# Patient Record
Sex: Female | Born: 1995 | Hispanic: No | Marital: Single | Smoking: Never smoker
Health system: Southern US, Community
[De-identification: ages and names within clinical notes are randomized; demographics above are authoritative.]

## PROBLEM LIST (undated history)

## (undated) DIAGNOSIS — F419 Anxiety disorder, unspecified: Secondary | ICD-10-CM

## (undated) DIAGNOSIS — Z905 Acquired absence of kidney: Secondary | ICD-10-CM

## (undated) HISTORY — PX: NEPHRECTOMY: SHX65

---

## 2015-02-03 ENCOUNTER — Encounter (HOSPITAL_COMMUNITY): Payer: Self-pay | Admitting: *Deleted

## 2015-02-03 ENCOUNTER — Emergency Department (HOSPITAL_COMMUNITY)
Admission: EM | Admit: 2015-02-03 | Discharge: 2015-02-03 | Disposition: A | Discharge status: Home / Self Care | Payer: Medicaid Other | Attending: Emergency Medicine | Admitting: Emergency Medicine

## 2015-02-03 DIAGNOSIS — N390 Urinary tract infection, site not specified: Secondary | ICD-10-CM

## 2015-02-03 DIAGNOSIS — M549 Dorsalgia, unspecified: Secondary | ICD-10-CM | POA: Diagnosis present

## 2015-02-03 DIAGNOSIS — Z3202 Encounter for pregnancy test, result negative: Secondary | ICD-10-CM | POA: Insufficient documentation

## 2015-02-03 HISTORY — DX: Acquired absence of kidney: Z90.5

## 2015-02-03 LAB — URINALYSIS, ROUTINE W REFLEX MICROSCOPIC
BILIRUBIN URINE: NEGATIVE
GLUCOSE, UA: NEGATIVE mg/dL
HGB URINE DIPSTICK: NEGATIVE
Ketones, ur: NEGATIVE mg/dL
Nitrite: NEGATIVE
PH: 6.5 (ref 5.0–8.0)
Protein, ur: NEGATIVE mg/dL
SPECIFIC GRAVITY, URINE: 1.008 (ref 1.005–1.030)
Urobilinogen, UA: 0.2 mg/dL (ref 0.0–1.0)

## 2015-02-03 LAB — COMPREHENSIVE METABOLIC PANEL
ALBUMIN: 4.2 g/dL (ref 3.5–5.0)
ALT: 15 U/L (ref 14–54)
AST: 19 U/L (ref 15–41)
Alkaline Phosphatase: 75 U/L (ref 38–126)
Anion gap: 9 (ref 5–15)
BUN: 9 mg/dL (ref 6–20)
CO2: 25 mmol/L (ref 22–32)
CREATININE: 0.8 mg/dL (ref 0.44–1.00)
Calcium: 9.7 mg/dL (ref 8.9–10.3)
Chloride: 104 mmol/L (ref 101–111)
GFR calc Af Amer: >60 mL/min (ref >60)
GFR calc non Af Amer: >60 mL/min (ref >60)
Glucose, Bld: 94 mg/dL (ref 65–99)
Potassium: 4.6 mmol/L (ref 3.5–5.1)
Sodium: 138 mmol/L (ref 135–145)
TOTAL PROTEIN: 7.4 g/dL (ref 6.5–8.1)
Total Bilirubin: 0.5 mg/dL (ref 0.3–1.2)

## 2015-02-03 LAB — URINE MICROSCOPIC-ADD ON

## 2015-02-03 LAB — CBC
HCT: 44.6 % (ref 36.0–46.0)
Hemoglobin: 14.7 g/dL (ref 12.0–15.0)
MCH: 29.6 pg (ref 26.0–34.0)
MCHC: 33 g/dL (ref 30.0–36.0)
MCV: 89.9 fL (ref 78.0–100.0)
PLATELETS: 415 10*3/uL — AB (ref 150–400)
RBC: 4.96 MIL/uL (ref 3.87–5.11)
RDW: 15.1 % (ref 11.5–15.5)
WBC: 8.2 10*3/uL (ref 4.0–10.5)

## 2015-02-03 LAB — POC URINE PREG, ED: Preg Test, Ur: NEGATIVE

## 2015-02-03 MED ORDER — CEPHALEXIN 500 MG PO CAPS: 500.0000 mg | ORAL_CAPSULE | Freq: Four times a day (QID) | ORAL | Status: AC

## 2015-02-03 NOTE — ED Provider Notes (Signed)
CSN: [id number]     Arrival date & time 02/03/15  1154 History   First MD Initiated Contact with Patient 02/03/15 1355     Chief Complaint  Patient presents with  . Back Pain     (Consider location/radiation/quality/duration/timing/severity/associated sxs/prior Treatment) HPI Ms. 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  She also has intermittent nausea and one episode of vomiting last week.  She states she was diagnosed with a UTI 5 weeks ago and was given antibiotics.  Her symptoms improved but returned after one week. She admits to urinary frequency for the past week. She denies any fever, shortness of breath, abdominal pain, diarrhea, constipation, hematuria or dysuria.  Past Medical History  Diagnosis Date  . History of nephrectomy     left removed when patient was a baby   History reviewed. No pertinent past surgical history. History reviewed. No pertinent family history. History  Substance Use Topics  . Smoking status: Never Smoker   . Smokeless tobacco: Not on file  . Alcohol Use: Not on file   OB History    No data available     Review of Systems  Genitourinary: Positive for flank pain.  All other systems reviewed and are negative.     Allergies  Iodides  Home Medications   Prior to Admission medications   Medication Sig Start Date End Date Taking? Authorizing Provider  cephALEXin (KEFLEX) 500 MG capsule Take 1 capsule (500 mg total) by mouth 4 (four) times daily. 02/03/15   Koda Routon Patel-Mills, PA-C   BP 125/83 mmHg  Pulse 73  Temp(Src) 98.5 F (36.9 C) (Oral)  Resp 18  Wt 153 lb (69.4 kg)  SpO2 99%  LMP 01/12/2015 Physical Exam  Constitutional: She is oriented to person, place, and time. She appears well-developed and well-nourished.  HENT:  Head: Normocephalic and atraumatic.  Eyes: Conjunctivae are normal.  Neck: Normal range of motion. Neck  supple.  Cardiovascular: Normal rate, regular rhythm and normal heart sounds.   Pulmonary/Chest: Effort normal and breath sounds normal.  Abdominal: Soft. There is no tenderness. There is no CVA tenderness.  Musculoskeletal: Normal range of motion.  Neurological: She is alert and oriented to person, place, and time.  Skin: Skin is warm and dry.    ED Course  Procedures (including critical care time) Labs Review Labs Reviewed  CBC - Abnormal; Notable for the following:    Platelets 415 (*)    All other components within normal limits  URINALYSIS, ROUTINE W REFLEX MICROSCOPIC - Abnormal; Notable for the following:    Leukocytes, UA MODERATE (*)    All other components within normal limits  URINE MICROSCOPIC-ADD ON - Abnormal; Notable for the following:    Squamous Epithelial / LPF FEW (*)    Bacteria, UA FEW (*)    All other components within normal limits  URINE CULTURE  COMPREHENSIVE METABOLIC PANEL  POC URINE PREG, ED    Imaging Review No results found.   EKG Interpretation None      MDM   Final diagnoses:  Urinary tract infection, acute   Patient with a history of left nephrectomy from Wilms tumor presents for right sided back pain x 1 month.  She also has urinary frequency but no dysuria or hematuria. No fever. She has not seen her nephrologist since she was 19 y.o. [place] Consulting civil [place]  [place]cp [place] 2 hours away in her hometown.   She is afebrile and her labs are unremarkable.  She does have a UTI.  I am not concerned about a kidney stone.  She has no reproducible abdominal or CVA tenderness of exam.  I have given her keflex and the resource guide to find a local provider.  I discussed return precautions such as fever or no improvement with symptoms in 2 days.     Catha GosselinHanna Patel-Mills, PA-C 02/03/15 1610  Elwin MochaBlair Walden, MD 02/03/15 [phone]

## 2015-02-03 NOTE — ED Notes (Signed)
PA at bedside.

## 2015-02-03 NOTE — ED Notes (Addendum)
Pt in c/o lower back pain for the last month, intermittent nausea, reports one episode of vomiting last week, no distress noted. Pt states she went to her PCP and dx with a UTI, took antibiotics without improvement of symptoms, pt had left kidney removed as a child.

## 2015-02-03 NOTE — Discharge Instructions (Signed)
Urinary Tract Infection Take antibiotics as prescribed.  Return for fever or if your symptoms do not improve in 2 days.  Follow up with a primary care provider using the resource guide or the health clinic at school. A urinary tract infection (UTI) can occur any place along the urinary tract. The tract includes the kidneys, ureters, bladder, and urethra. A type of germ called bacteria often causes a UTI. UTIs are often helped with antibiotic medicine.  HOME CARE   If given, take antibiotics as told by your doctor. Finish them even if you start to feel better.  Drink enough fluids to keep your pee (urine) clear or pale yellow.  Avoid tea, drinks with caffeine, and bubbly (carbonated) drinks.  Pee often. Avoid holding your pee in for a long time.  Pee before and after having sex (intercourse).  Wipe from front to back after you poop (bowel movement) if you are a woman. Use each tissue only once. GET HELP RIGHT AWAY IF:   You have back pain.  You have lower belly (abdominal) pain.  You have chills.  You feel sick to your stomach (nauseous).  You throw up (vomit).  Your burning or discomfort with peeing does not go away.  You have a fever.  Your symptoms are not better in 3 days. MAKE SURE YOU:   Understand these instructions.  Will watch your condition.  Will get help right away if you are not doing well or get worse. Document Released: 02/22/2008 Document Revised: 05/30/2012 Document Reviewed: 04/05/2012 [facility] Patient Information 2015 [place], [place]. This information is not intended to replace advice given to you by your health care provider. Make sure you discuss any questions you have with your health care provider.  Emergency Department Resource Guide 1) Find a Doctor and Pay Out of Pocket Although you won't have to find out who is covered by your insurance plan, it is a good idea to ask around and get recommendations. You will then need to call the office and see if  the doctor you have chosen will accept you as a new patient and what types of options they offer for patients who are self-pay. Some doctors offer discounts or will set up payment plans for their patients who do not have insurance, but you will need to ask so you aren't surprised when you get to your appointment.  2) Contact Your Local Health Department Not all health departments have doctors that can see patients for sick visits, but many do, so it is worth a call to see if yours does. If you don't know where your local health department is, you can check in your phone book. The CDC also has a tool to help you locate your state's health department, and many state websites also have listings of all of their local health departments.  3) Find a Walk-in Clinic If your illness is not likely to be very severe or complicated, you may want to try a walk in clinic. These are popping up all over the country in pharmacies, drugstores, and shopping centers. They're usually staffed by nurse practitioners or physician assistants that have been trained to treat common illnesses and complaints. They're usually fairly quick and inexpensive. However, if you have serious medical issues or chronic medical problems, these are probably not your best option.  No Primary Care Doctor: - Call Health Connect at  [phone] - they can help you locate a primary care doctor that  accepts your insurance, provides certain services, etc. -  Physician Referral Service- [phone]  Chronic Pain Problems: Organization         Address  Phone   Notes  [facility]  [phone] Patients need to be referred by their primary care doctor.   Medication Assistance: Organization         Address  Phone   Notes  [facility] Medication [facility] [address] [place] [place], [place] [phone] --Must be a resident of [facility] -- Must have NO insurance coverage whatsoever (no  Medicaid/ Medicare, etc.) -- The pt. MUST have a primary care doctor that directs their care regularly and follows them in the community   [facility]ist  [phone]   [employer]  [phone]    Agencies that provide inexpensive medical care: Organization         Address  Phone   Notes  [facility]  [phone]   [facility]    [phone]   [facility] [address] [place], [place] [address] [phone]   [facility] [address] [place], [place] [phone]   [facility]anned Parenthood    [phone]   [facility]    [phone]   [facility]  [address], [place] Phone:  [phone], Fax:  [phone] Hours of Operation:  9 am - 6 pm, M-F.  Also accepts Medicaid/Medicare and self-pay.  [facility]  [address], [place] Phone: [phone], Fax: [phone]. Hours of Operation:  8:30 am - 5:30 pm, M-F.  Also accepts Medicaid and self-pay.  [facility] [place] [address], [place] Phone: [phone]   [facility] [address] Natasha Bence [place], [place] [phone], Ext. [phone] Mondays & Thursdays: 7-9 AM.  First 15 patients are seen on a first come, first serve basis.    Medicaid-accepting [facility] Providers:  Organization         Address  Phone   Notes  [facility] [address],  [phone] Also accepts self-pay patients.  [facility] [address] Laurell Josephs [place], [place]  [phone]   [facility] [address], [place] [phone]   [facility] [address] [place] [phone]   Renaye Rakers [address], [place]   [phone] Only accepts [place] Access [place] patients after they have their name applied to their card.    Self-Pay (no insurance) in [facility]:  Organization         Address  Phone   Notes  Sickle Cell Patients, [facility] [address] [place], [place] [phone]   [facility] [address] [place], [place] [phone]   [facility] [place]  [address], [place] [phone]   [facility]/Dr. Osei-Bonsu  [address] [place] or [address], [place] [phone] Phone number for both [place] and [place] locations is the same.  [facility] [address] [place] [phone]   [facility] [address], [place] or [address] [phone]   [facility] [address] [place], [place] [phone],  phone; [phone], fax Sees patients 1st and 3rd Saturday of every month.  Must not qualify for public or private insurance (i.e. Medicaid, Medicare, [place] Health Choice, Veterans' Benefits)  Household income should be no more than 200% of the poverty level The clinic cannot treat you if you are pregnant or think you are pregnant  Sexually transmitted diseases are not treated at the clinic.    Dental Care: Organization         Address  Phone  Notes  [facility] [address], [place] [phone] Accepts children up to age 21 who are enrolled in [place] or [place] Health Choice; pregnant women with a Medicaid card; and children who have applied for Medicaid or [place] Health Choice, but were declined, whose parents can pay a reduced fee at time of service.  [facility] [place]  [address], [place] [phone] Accepts children up to age 21 who are enrolled in [place] or [place] Health Choice; pregnant women with a Medicaid card; and children who have applied for Medicaid or [place] Health Choice,  but were declined, whose parents can pay a reduced fee at time of service.  [facility]  [address], [place] [phone] Patients are seen by appointment only. Walk-ins are not accepted. [facility] will see patients 318 years of age and older. Monday - Tuesday (8am-5pm) Most Wednesdays (8:30-5pm) $30 per visit, cash only  [facility]  [address], [facility] [place] [phone] Patients are seen by appointment only. Walk-ins are not accepted. [facility] will see patients 21 years of age and older. One Wednesday Evening (Monthly: Volunteer Based).  $30 per visit, cash only  [employer] [facility]  [phone] for adults; Children under age 544, call Graduate Pediatric Dentistry at [phone]. Children aged 364-14, please call [phone] to request a pediatric application.  Dental services are provided in all areas of dental care including fillings, crowns and bridges, complete and partial dentures, implants, gum treatment, root canals, and extractions. Preventive care is also provided. Treatment is provided to both adults and children. Patients are selected via a lottery and there is often a waiting list.   [facility] [address], [place]  [phone] [email or web address]   [facility] [address], [place], [place] [phone], Ext. [phone] Second and Fourth Thursday of each month, opens at 6:30 AM; Clinic ends at 9 AM.  Patients are seen on a first-come first-served basis, and a limited number are seen during each clinic.   [facility]  [address], [place], [place] [phone]   Eligibility Requirements You must have lived in [place], [place], or [place] counties for at least the last three months.   You cannot be eligible for state or federal sponsored [place] Cityhealthcare insurance, including CIGNAVeterans Administration, [place]icaid, or [employer]Medicare.   You generally  cannot be eligible for healthcare insurance through your employer.    How to apply: Eligibility screenings are held every Tuesday and Wednesday afternoon from 1:00 pm until 4:00 pm. You do not need an appointment for the interview!  [facility] [address], [place], [place] [phone]   [facility]  [phone]   [facility]  [phone]   [facility]  [phone]    Behavioral Health Resources in  the Community: Intensive Outpatient Programs Organization         Address  Phone  Notes  [facility] [address] [place], [place] [phone]   [facility] Outpatient [address] [place], [place] [phone]   [facility] [address] [place], [place]  [phone]   [facility] [address],  [place], [place] [phone] or [phone]   Substance Abuse Resources Organization         Address  Phone  Notes  [facility] Drug Services  [phone]   [facility]  [phone]   [facility]  [phone]   Floydene Flock  [phone]   Residential & Outpatient Substance Abuse Program  [phone]   Psychological Services Organization         Address  Phone  Notes  [facility]  [phone]   [facility]  [phone]   [facility] [address] [place] [phone] or [phone]    Mobile Crisis Teams Organization         Address  Phone  Notes  [facility], Mobile Crisis Care Unit  [phone]   [facility]  [address] [place], [place] [phone]   Doristine Locks [address] [place] [phone]    Self-Help/Support Groups Organization         Address  Phone             Notes  [facility] [place] - variety of support groups  [phone]- I7437963 Call for more  information  Narcotics Anonymous (NA), [facility] [address], [employer] [place]  2 meetings at this location   Statistician         Address  Phone  Notes  [facility] [address],    [place]  [phone]   [facility]  [address] [place] [address], [place], [place] [phone]   [facility] [address] [place], [place] [phone] Admissions: 8am-3pm M-F  [facility] [address],    [place], [place] [phone]   [facility] [address] [place], [place], [place] [phone]   [facility] [address]  [place], [place] [phone]   [facility] - Intensive Outpatient [address]ure[address], [place], [place] [phone]   [facility] ([facility] [address] [place]  [place], [place] [phone] or [phone]   [facility] ([facility]) [address], [place], [place] [phone] Accepts Medicaid  [facility] [address],  [place] [phone] Substance Abuse/Addiction Treatment   [facility] Organization         Address  Phone  Notes  [facility]  [phone]   Angie Fava, PhD [address] Ervin Knack [place], [place]   [phone] or [phone]   [facility] Behavioral   [address] [place], [place] [phone]   [facility] [address], [place], [place] [phone] Insurance/Medicaid/sponsorship through [employer] and Families [address]                                    [place], [place] [phone] Therapy/tele-psych/case  [facility] [address], [place] [phone]  Dr. Adele Schilder  [phone]   [facility] Dept. 1) [address], [place] 2) [place] 3)  [address], [place] [phone]  [phone]   [place] [phone] or [phone] (After Hours)

## 2015-02-05 LAB — URINE CULTURE: Colony Count: 35000

## 2015-02-06 ENCOUNTER — Telehealth (HOSPITAL_BASED_OUTPATIENT_CLINIC_OR_DEPARTMENT_OTHER): Payer: Self-pay | Admitting: Emergency Medicine

## 2015-02-06 NOTE — Telephone Encounter (Signed)
Post ED Visit - Positive Culture Follow-up  Culture report reviewed by antimicrobial stewardship pharmacist: []  Wes Dulaney, Pharm.D., BCPS [x]  Celedonio MiyamotoJeremy Frens, Pharm.D., BCPS []  Georgina PillionElizabeth Martin, [address] BoulevardPharm.D., BCPS []  LinnMinh Pham, [address] BoulevardPharm.D., BCPS, AAHIVP []  Estella HuskMichelle Turner, Pharm.D., BCPS, AAHIVP []  Elder CyphersLorie Poole, [address] BoulevardPharm.D., BCPS  Positive urine Culture Group B strep Treated with cephalexin, organism sensitive to the same and no further patient follow-up is required at this time.  Berle MullMiller, Jeremia Groot 02/06/2015, 1:02 PM

## 2016-12-14 ENCOUNTER — Emergency Department (HOSPITAL_COMMUNITY): Payer: Self-pay

## 2016-12-14 ENCOUNTER — Encounter (HOSPITAL_COMMUNITY): Payer: Self-pay | Admitting: Emergency Medicine

## 2016-12-14 ENCOUNTER — Emergency Department (HOSPITAL_COMMUNITY)
Admission: EM | Admit: 2016-12-14 | Discharge: 2016-12-14 | Disposition: A | Discharge status: Home / Self Care | Payer: Self-pay | Attending: Emergency Medicine | Admitting: Emergency Medicine

## 2016-12-14 DIAGNOSIS — R1013 Epigastric pain: Secondary | ICD-10-CM | POA: Insufficient documentation

## 2016-12-14 DIAGNOSIS — R0789 Other chest pain: Secondary | ICD-10-CM | POA: Insufficient documentation

## 2016-12-14 HISTORY — DX: Anxiety disorder, unspecified: F41.9

## 2016-12-14 LAB — BASIC METABOLIC PANEL
ANION GAP: 8 (ref 5–15)
BUN: 10 mg/dL (ref 6–20)
CALCIUM: 9.4 mg/dL (ref 8.9–10.3)
CO2: 23 mmol/L (ref 22–32)
Chloride: 106 mmol/L (ref 101–111)
Creatinine, Ser: 0.85 mg/dL (ref 0.44–1.00)
GFR calc Af Amer: >60 mL/min (ref >60)
Glucose, Bld: 90 mg/dL (ref 65–99)
Potassium: 4.1 mmol/L (ref 3.5–5.1)
SODIUM: 137 mmol/L (ref 135–145)

## 2016-12-14 LAB — I-STAT BETA HCG BLOOD, ED (MC, WL, AP ONLY): I-stat hCG, quantitative: <5 m[IU]/mL (ref <5)

## 2016-12-14 LAB — CBC
HCT: 43.2 % (ref 36.0–46.0)
HEMOGLOBIN: 14.4 g/dL (ref 12.0–15.0)
MCH: 30.5 pg (ref 26.0–34.0)
MCHC: 33.3 g/dL (ref 30.0–36.0)
MCV: 91.5 fL (ref 78.0–100.0)
Platelets: 415 10*3/uL — ABNORMAL HIGH (ref 150–400)
RBC: 4.72 MIL/uL (ref 3.87–5.11)
RDW: 13.1 % (ref 11.5–15.5)
WBC: 8.4 10*3/uL (ref 4.0–10.5)

## 2016-12-14 LAB — HEPATIC FUNCTION PANEL
ALT: 17 U/L (ref 14–54)
AST: 22 U/L (ref 15–41)
Albumin: 4.6 g/dL (ref 3.5–5.0)
Alkaline Phosphatase: 69 U/L (ref 38–126)
Total Bilirubin: 0.7 mg/dL (ref 0.3–1.2)
Total Protein: 7.8 g/dL (ref 6.5–8.1)

## 2016-12-14 LAB — I-STAT TROPONIN, ED: Troponin i, poc: 0 ng/mL (ref 0.00–0.08)

## 2016-12-14 LAB — LIPASE, BLOOD: Lipase: 28 U/L (ref 11–51)

## 2016-12-14 MED ORDER — SUCRALFATE 1 G PO TABS
1.0000 g | ORAL_TABLET | Freq: Once | ORAL | Status: AC
  Administered 2016-12-14: 1 g via ORAL
  Filled 2016-12-14: qty 1

## 2016-12-14 MED ORDER — OMEPRAZOLE 20 MG PO CPDR: 20.0000 mg | DELAYED_RELEASE_CAPSULE | Freq: Every day | ORAL | 0 refills | Status: AC

## 2016-12-14 MED ORDER — IBUPROFEN 200 MG PO TABS
600.0000 mg | ORAL_TABLET | Freq: Once | ORAL | Status: AC
  Administered 2016-12-14: 600 mg via ORAL
  Filled 2016-12-14: qty 3

## 2016-12-14 NOTE — ED Provider Notes (Signed)
[facility]-EMERGENCY DEPT Provider Note   CSN: [id number] Arrival date & time: 12/14/16  1345     History   Chief Complaint Chief Complaint  Patient presents with  . Chest Pain  . Tingling    HPI Ashley Cunningham is a 21 y.o. female.  HPI  Pt comes in with cc of abd pain, chest discomfort. Pt has no medical hx, she is s/p L sided nephrectomy due to Wilm's tumor. Pt reports that for a 2 days she has been having epigastric abd pain and chest pain. Chest pain feels like tightness and she is having heartburn. Pt is not sure if the symptoms are related to food intake. Pt has no worsening of the pain with exertion. Pt has hx of anxiety, and she does get some dib intermittently. Pt also had nausea intermittently.  Pt has no hx of PE, DVT and denies any exogenous hormone (testosterone / estrogen) use, long distance travels or surgery in the past 6 weeks, active cancer, recent immobilization. Pt's BM are normal. No pelvic pain. Patient has no pain with urination, blood in the urine, or frequent urination. Pt also denies any vaginal discharge or bleeding.     Past Medical History:  Diagnosis Date  . Anxiety   . History of nephrectomy    left removed when patient was a baby    There are no active problems to display for this patient.   Past Surgical History:  Procedure Laterality Date  . NEPHRECTOMY     Right    OB History    No data available       Home Medications    Prior to Admission medications   Medication Sig Start Date End Date Taking? Authorizing Provider  cephALEXin (KEFLEX) 500 MG capsule Take 1 capsule (500 mg total) by mouth 4 (four) times daily. Patient not taking: Reported on 12/14/2016 02/03/15   Catha GosselinHanna Patel-Mills, PA-C  omeprazole (PRILOSEC) 20 MG capsule Take 1 capsule (20 mg total) by mouth daily. 12/14/16   Derwood KaplanAnkit Moshe Wenger, MD    Family History No family history on file.  Social History Social History  Substance Use Topics  . Smoking status:  Never Smoker  . Smokeless tobacco: Never Used  . Alcohol use No     Allergies   Iodides   Review of Systems Review of Systems  ROS 10 Systems reviewed and are negative for acute change except as noted in the HPI.     Physical Exam Updated Vital Signs BP (!) 141/102   Pulse 74   Temp 98.4 F (36.9 C) (Oral)   Resp 17   Ht 5\' 4"  (1.626 m)   Wt 150 lb (68 kg)   LMP 11/03/2016   SpO2 100%   BMI 25.75 kg/m   Physical Exam  Constitutional: She is oriented to person, place, and time. She appears well-developed and well-nourished.  HENT:  Head: Normocephalic and atraumatic.  Eyes: EOM are normal. Pupils are equal, round, and reactive to light.  Neck: Neck supple.  Cardiovascular: Normal rate, regular rhythm, normal heart sounds and intact distal pulses.   No murmur heard. Pulmonary/Chest: Effort normal. No respiratory distress. She has no wheezes.  Abdominal: Soft. She exhibits no distension. There is tenderness. There is no rebound and no guarding.  Epigastric and RUQ tenderness  Neurological: She is alert and oriented to person, place, and time.  Skin: Skin is warm and dry.  Nursing note and vitals reviewed.    ED Treatments / Results  Labs (all  labs ordered are listed, but only abnormal results are displayed) Labs Reviewed  CBC - Abnormal; Notable for the following:       Result Value   Platelets 415 (*)    All other components within normal limits  HEPATIC FUNCTION PANEL - Abnormal; Notable for the following:    Bilirubin, Direct <0.1 (*)    All other components within normal limits  BASIC METABOLIC PANEL  LIPASE, BLOOD  I-STAT TROPOININ, ED  I-STAT BETA HCG BLOOD, ED ([facility], [facility], [facility] ONLY)    EKG  EKG Interpretation  Date/Time:  Wednesday December 14 2016 14:01:21 EDT Ventricular Rate:  75 PR Interval:    QRS Duration: 87 QT Interval:  365 QTC Calculation: 408 R Axis:   82 Text Interpretation:  Sinus rhythm RSR' in V1 or V2, right VCD or RVH ST elev,  probable normal early repol pattern No acute changes No old tracing to compare Confirmed by Rhunette Croft, MD, Janey Genta [phone]) on 12/14/2016 6:07:16 PM       Radiology Dg Chest 2 View  Result Date: 12/14/2016 CLINICAL DATA:  Anxiety attack and chest tightness, initial encounter EXAM: CHEST  2 VIEW COMPARISON:  None. FINDINGS: The heart size and mediastinal contours are within normal limits. Both lungs are clear. The visualized skeletal structures are unremarkable. IMPRESSION: No active cardiopulmonary disease. Electronically Signed   By: Alcide Clever M.D.   On: 12/14/2016 15:15   US Abdomen Limited  Result Date: 12/14/2016 CLINICAL DATA:  Right upper quadrant pain x1 day EXAM: US ABDOMEN LIMITED - RIGHT UPPER QUADRANT COMPARISON:  None. FINDINGS: Gallbladder: No gallstones or wall thickening visualized. No sonographic Murphy sign noted by sonographer. Common bile duct: Diameter: 3.6 mm.  No choledocholithiasis. Liver: No focal lesion identified. Within normal limits in parenchymal echogenicity. IMPRESSION: Unremarkable right upper quadrant abdominal ultrasound. Electronically Signed   By: Tollie Eth M.D.   On: 12/14/2016 19:21    Procedures Procedures (including critical care time)  Medications Ordered in ED Medications  sucralfate (CARAFATE) tablet 1 g (not administered)  ibuprofen (ADVIL,MOTRIN) tablet 600 mg (not administered)     Initial Impression / Assessment and Plan / ED Course  I have reviewed the triage vital signs and the nursing notes.  Pertinent labs & imaging results that were available during my care of the patient were reviewed by me and considered in my medical decision making (see chart for details).     Pt comes in with cc of epigastric chest pain. Hx of anxiety - per patient.  DDx includes: Pancreatitis Hepatobiliary pathology including cholecystitis Gastritis/PUD ACS syndrome PE  Pt is PERC neg. Pt has no cardiac risk factors. Trops  And ekg reassuring.  Pt  has epigastric tenderness - but lipase is normal and LFTs are fine. [place] ordered.  If workup neg, we will d/c with pcp f.u for PUD eval.    Final Clinical Impressions(s) / ED Diagnoses   Final diagnoses:  Atypical chest pain  Acute epigastric pain    New Prescriptions New Prescriptions   OMEPRAZOLE (PRILOSEC) 20 MG CAPSULE    Take 1 capsule (20 mg total) by mouth daily.     Derwood Kaplan, MD 12/14/16 2021

## 2016-12-14 NOTE — Discharge Instructions (Signed)
All the results in the ER are normal, labs and imaging. We are not sure what is causing your symptoms. It could be peptic ulcer disease - so er have started you on medications for that. The workup in the ER is not complete, and is limited to screening for life threatening and emergent conditions only, so please see a primary care doctor for further evaluation.  Please return to the ER if your symptoms worsen; you have increased pain, fevers, chills, inability to keep any medications down, confusion. Otherwise see the outpatient doctor as requested.

## 2016-12-14 NOTE — ED Triage Notes (Signed)
Patient states "I think I am having an anxiety attack." Patient reports tingling in bilateral upper extremities and chest tightness starting yesterday. Hx of anxiety.

## 2018-01-08 IMAGING — CR DG CHEST 2V
2 series · 2 of 2 positions shown · non-contrast
Comparison: None.

CLINICAL DATA: Anxiety attack and chest tightness, initial
encounter

EXAM:
CHEST  2 VIEW

[w chest pa]
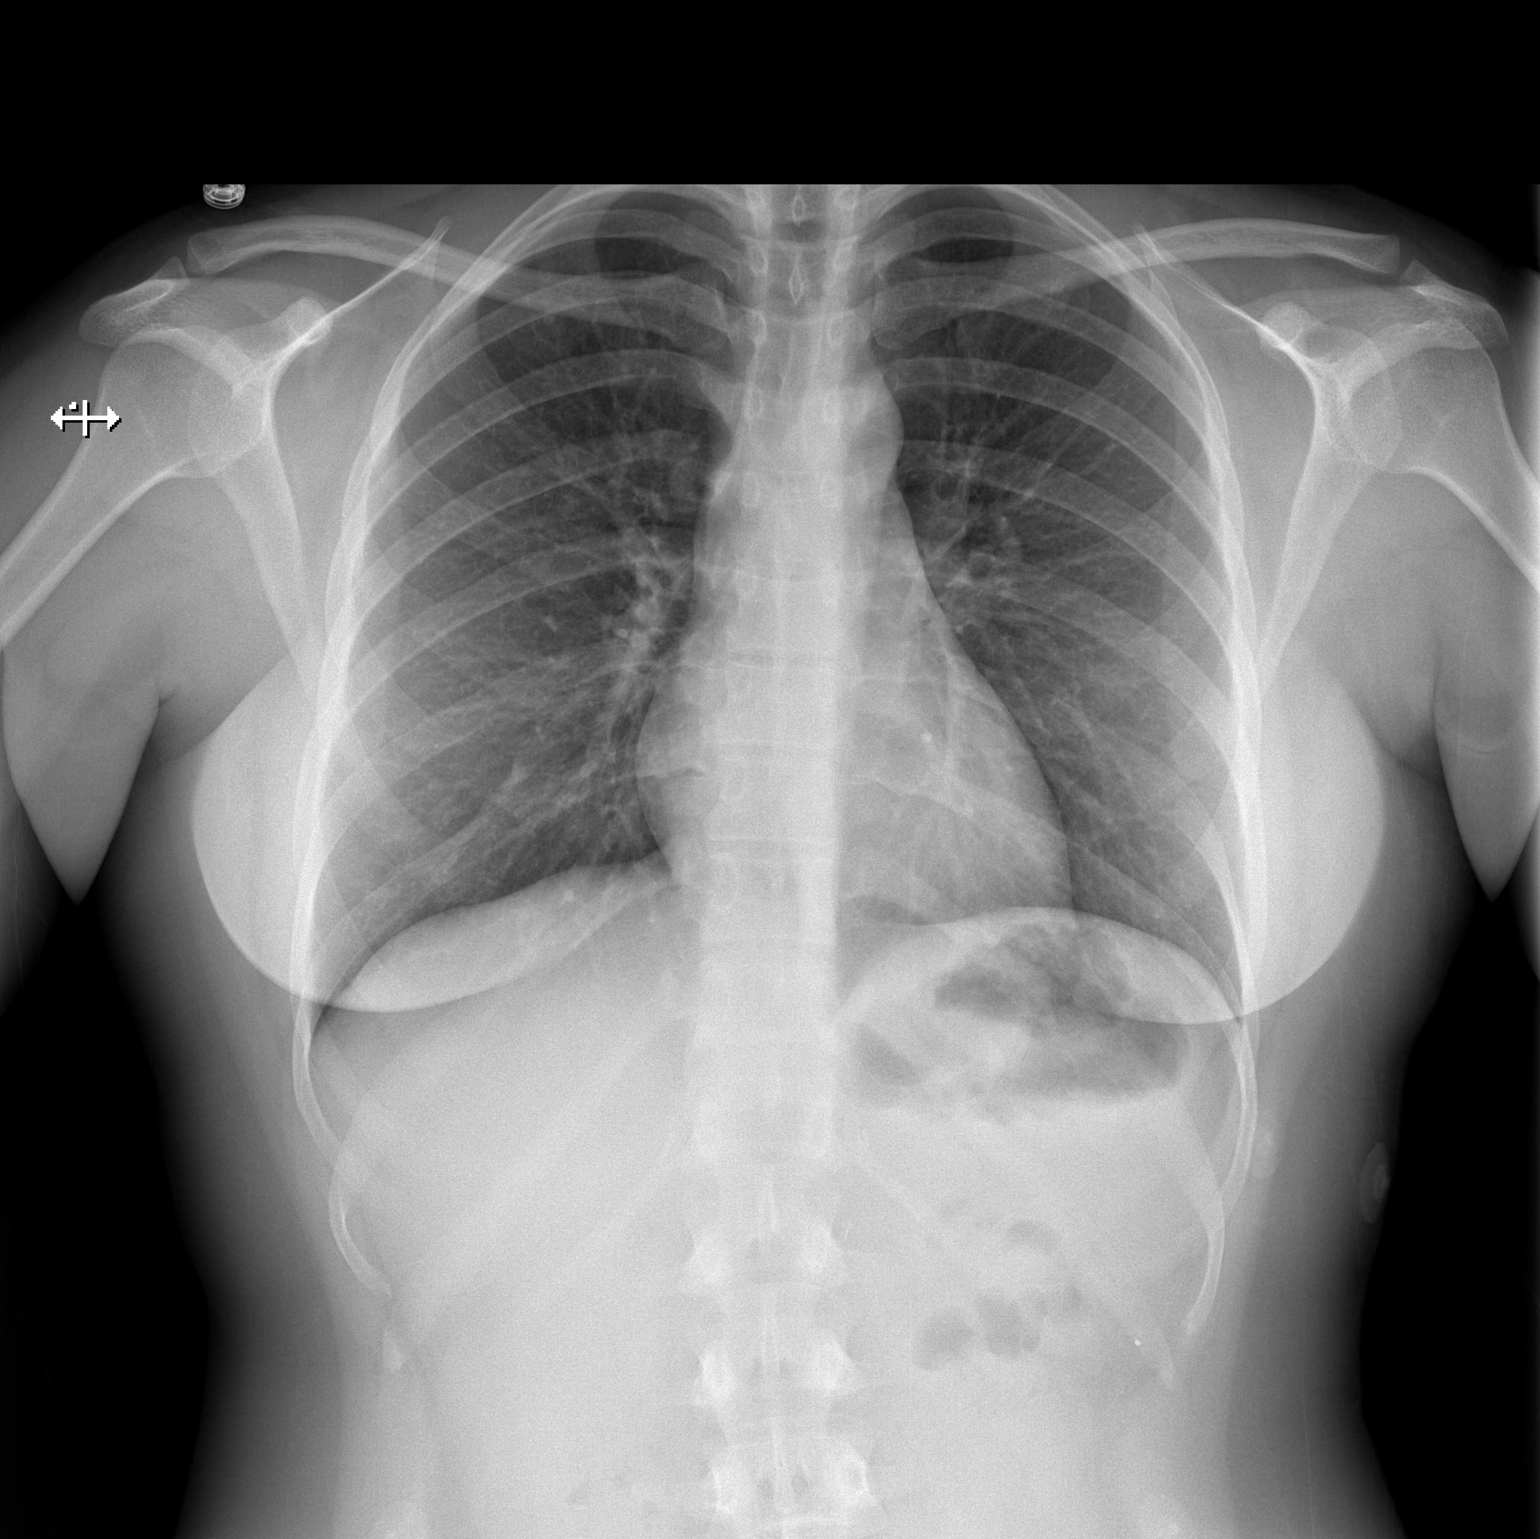

[w chest lat]
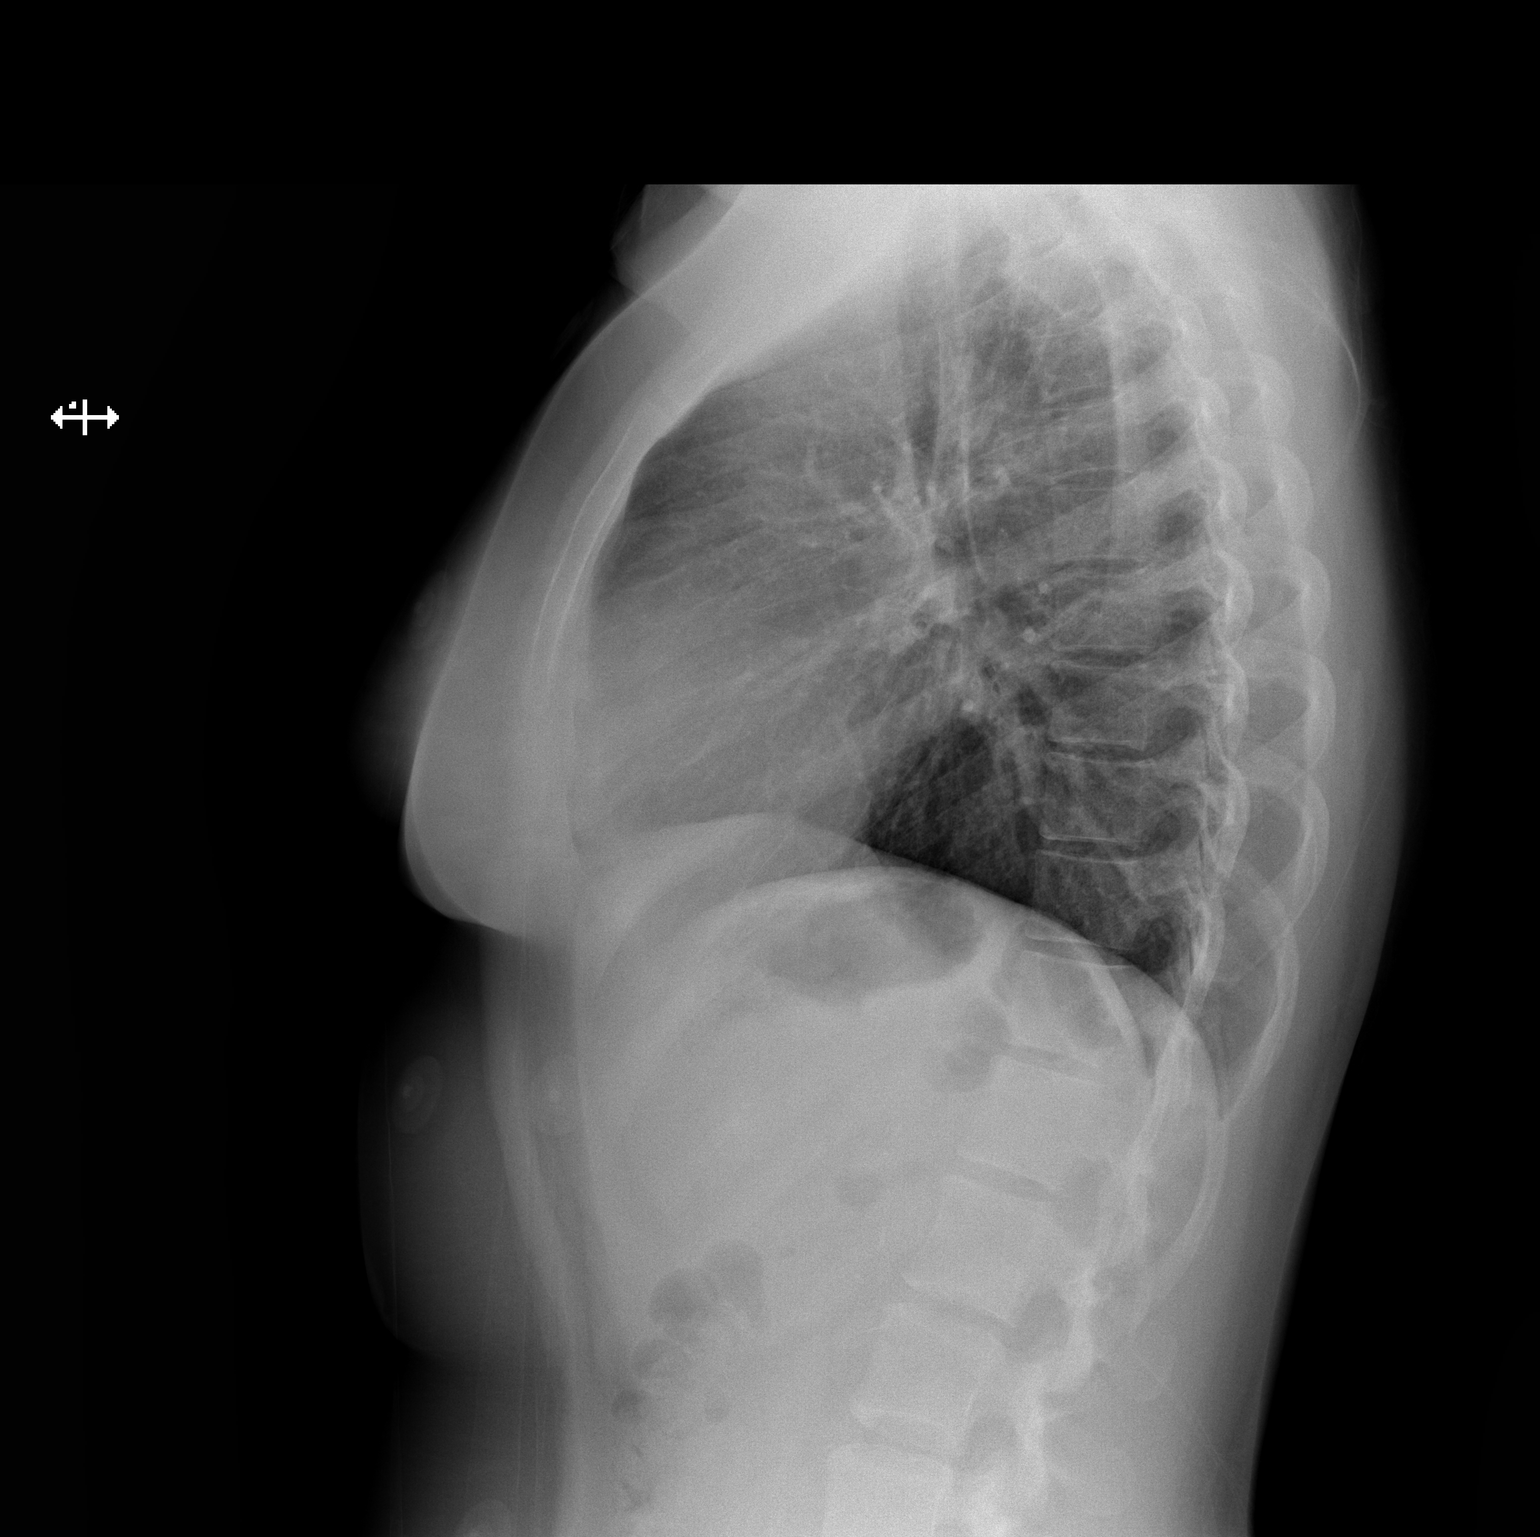

[2 of 2 positions shown; findings below may reference images not displayed]

FINDINGS: The heart size and mediastinal contours are within normal limits.
Both lungs are clear. The visualized skeletal structures are
unremarkable.
IMPRESSION: No active cardiopulmonary disease.

## 2018-09-21 IMAGING — US US ABDOMEN LIMITED
1 series · 14 of 25 positions shown · non-contrast
Comparison: None.

CLINICAL DATA: Right upper quadrant pain x1 day

EXAM:
US ABDOMEN LIMITED - RIGHT UPPER QUADRANT

[Series 1: us abdomen limited · 0.14mm/px · 14 of 39 slices shown]
[im 1/39]
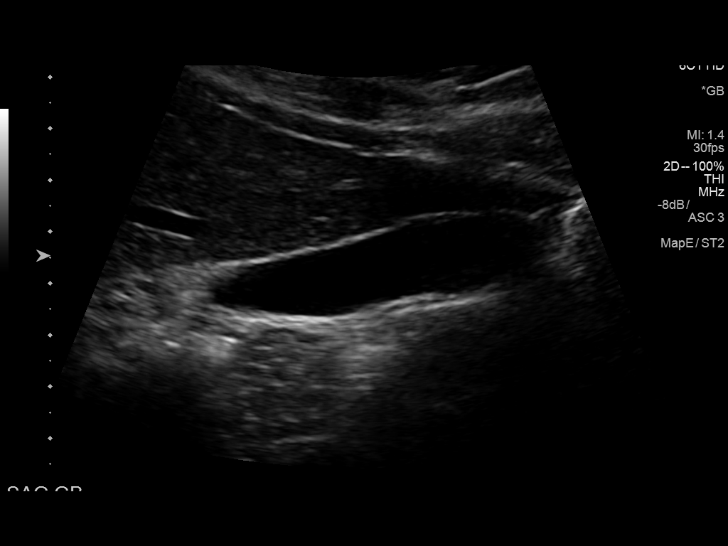
[im 4/39]
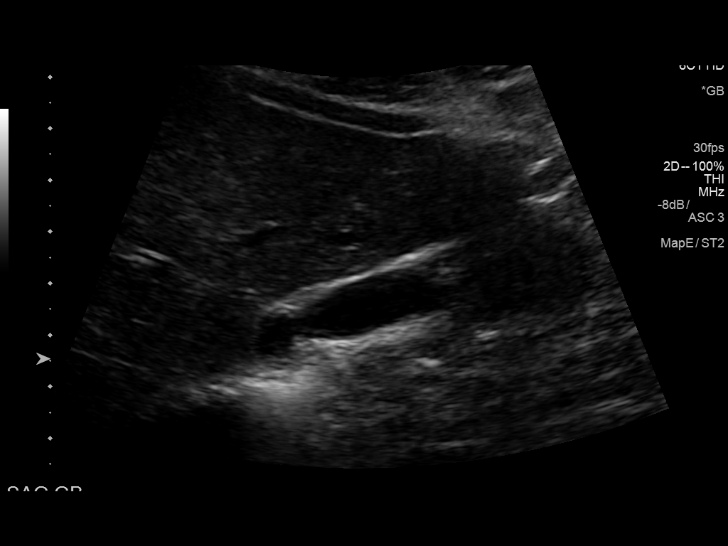
[im 7/39]
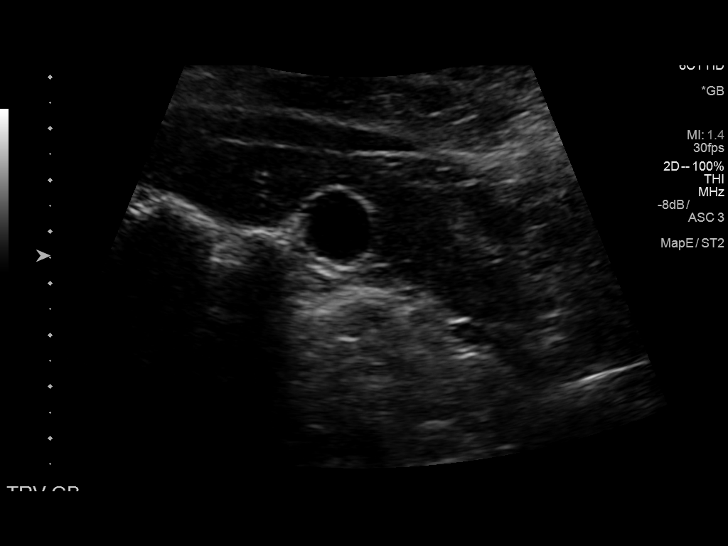
[im 10/39]
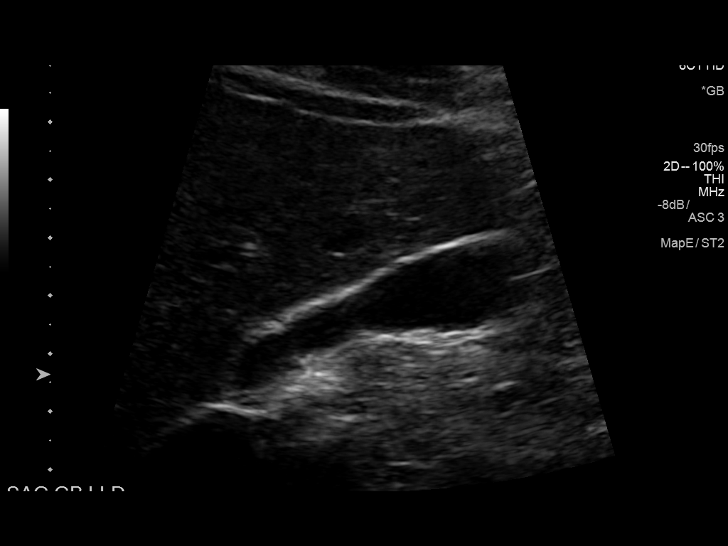
[im 13/39]
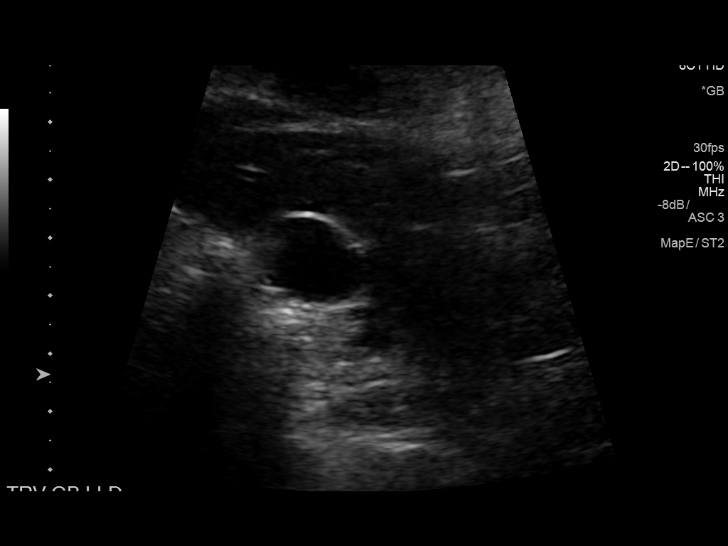
[im 15/39]
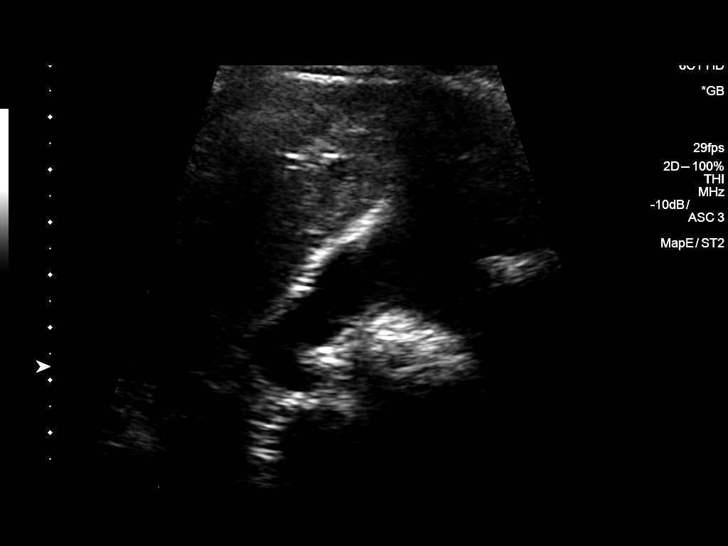
[im 18/39]
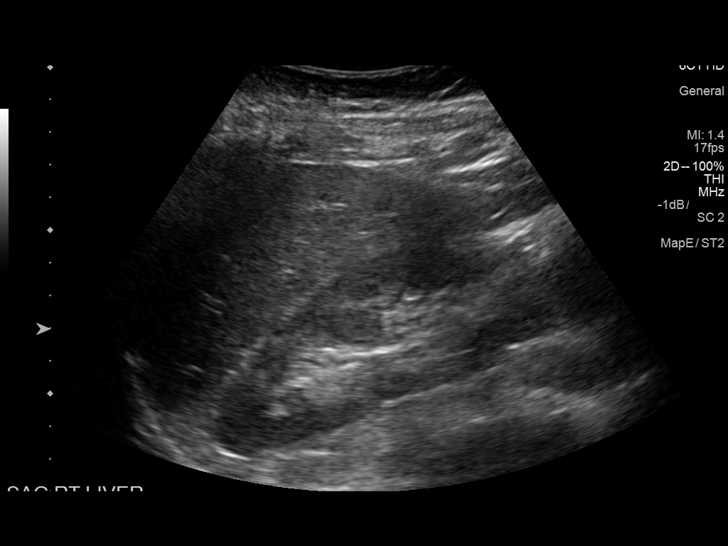
[im 21/39]
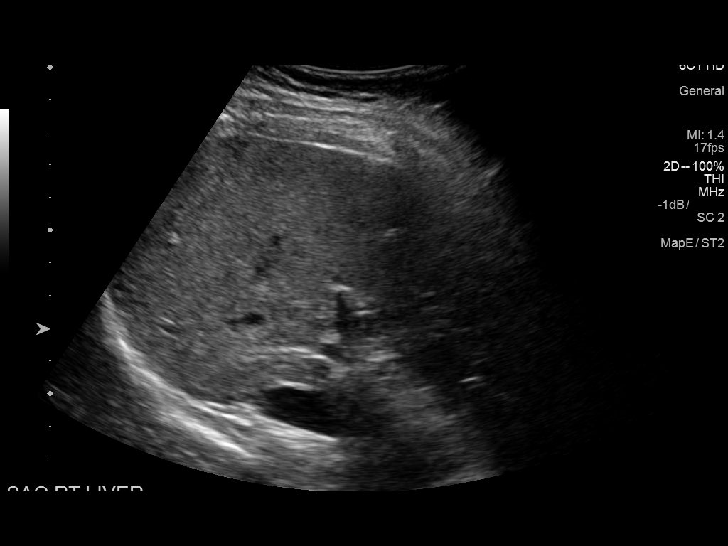
[im 24/39]
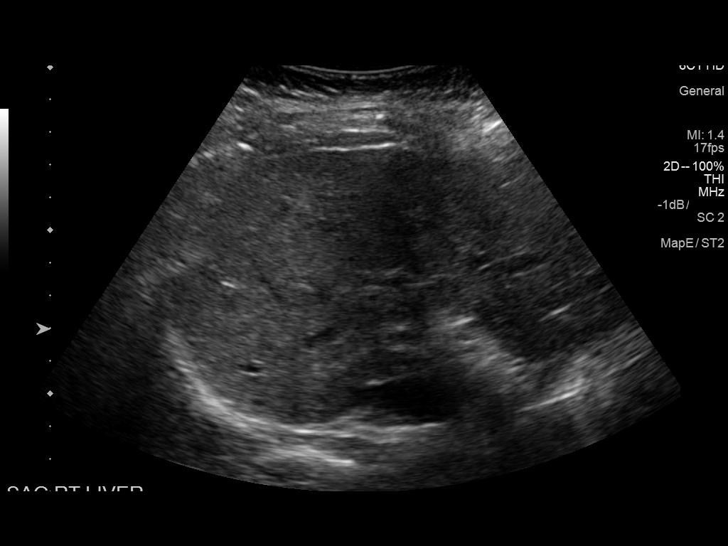
[im 26/39]
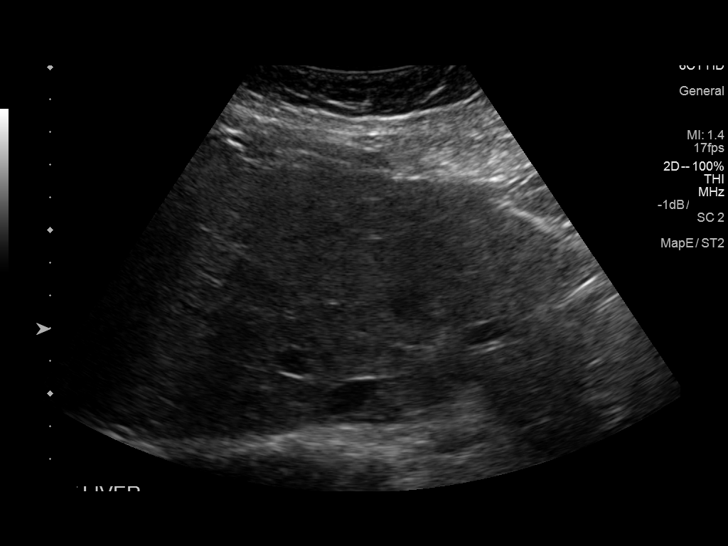
[im 29/39]
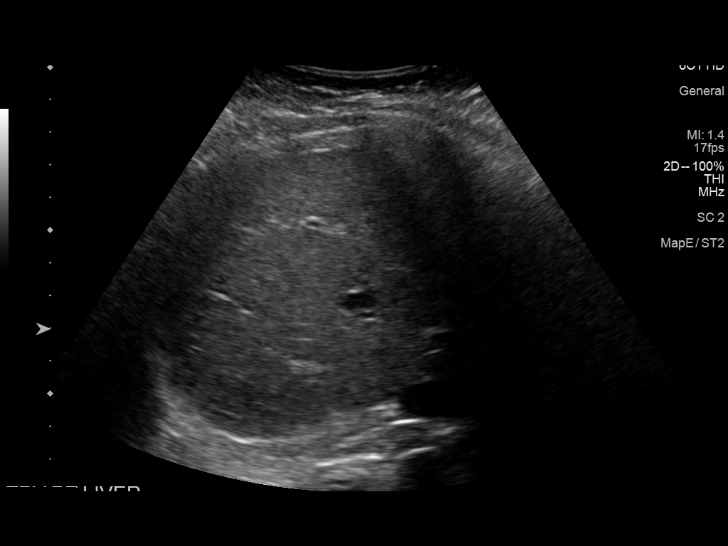
[im 32/39]
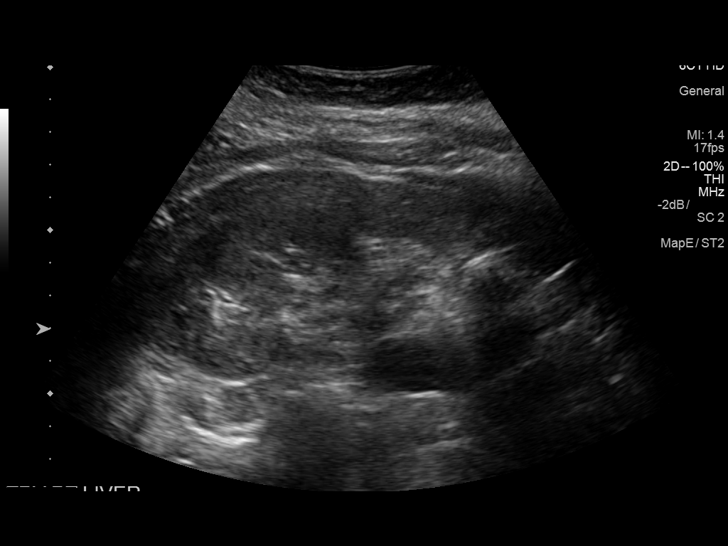
[im 35/39]
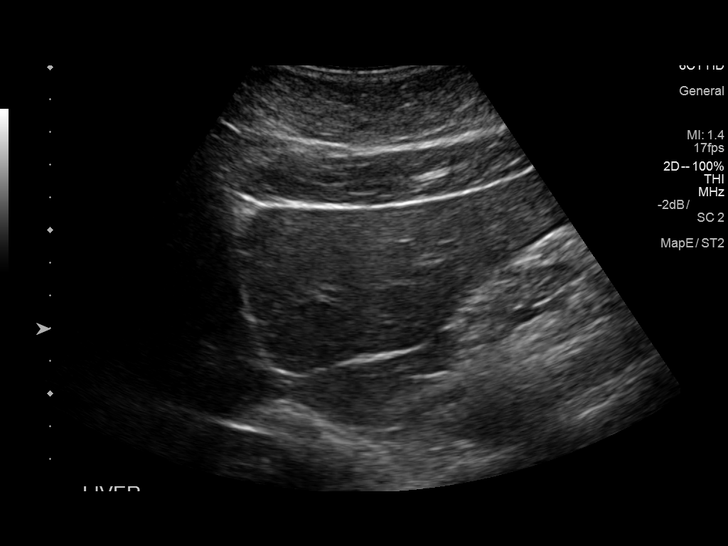
[im 39/39]
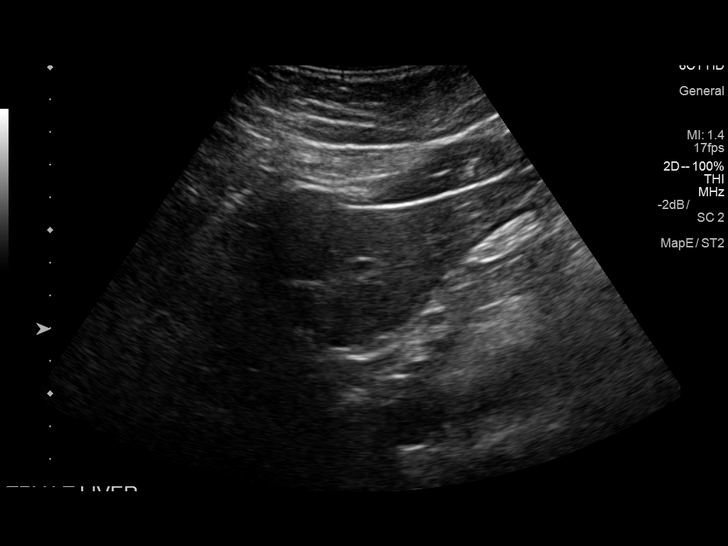

[14 of 25 positions shown; findings below may reference images not displayed]

FINDINGS: Gallbladder:

No gallstones or wall thickening visualized. No sonographic Murphy
sign noted by sonographer.

Common bile duct:

Diameter: 3.6 mm.  No choledocholithiasis.

Liver:

No focal lesion identified. Within normal limits in parenchymal
echogenicity.
IMPRESSION: Unremarkable right upper quadrant abdominal ultrasound.
